# Patient Record
Sex: Female | Born: 1995 | Hispanic: Yes | Marital: Single
Health system: Southern US, Community
[De-identification: ages and names within clinical notes are randomized; demographics above are authoritative.]

---
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
## 2016-12-14 ENCOUNTER — Emergency Department
Admission: EM | Admit: 2016-12-14 | Discharge: 2016-12-14 | Disposition: A | Discharge status: Home / Self Care | Payer: Medicaid Other | Attending: Emergency Medicine | Admitting: Emergency Medicine

## 2016-12-14 ENCOUNTER — Emergency Department: Payer: Medicaid Other

## 2016-12-14 DIAGNOSIS — Y93E5 Activity, floor mopping and cleaning: Secondary | ICD-10-CM | POA: Insufficient documentation

## 2016-12-14 DIAGNOSIS — Y990 Civilian activity done for income or pay: Secondary | ICD-10-CM | POA: Insufficient documentation

## 2016-12-14 DIAGNOSIS — S59911A Unspecified injury of right forearm, initial encounter: Secondary | ICD-10-CM | POA: Diagnosis present

## 2016-12-14 DIAGNOSIS — Y92002 Bathroom of unspecified non-institutional (private) residence single-family (private) house as the place of occurrence of the external cause: Secondary | ICD-10-CM | POA: Insufficient documentation

## 2016-12-14 DIAGNOSIS — S51811A Laceration without foreign body of right forearm, initial encounter: Secondary | ICD-10-CM | POA: Diagnosis not present

## 2016-12-14 DIAGNOSIS — W2209XA Striking against other stationary object, initial encounter: Secondary | ICD-10-CM | POA: Insufficient documentation

## 2016-12-14 MED ORDER — LIDOCAINE-EPINEPHRINE 2 %-1:100000 IJ SOLN
3.0000 mL | Freq: Once | INTRAMUSCULAR | Status: AC
  Administered 2016-12-14: 3 mL
  Filled 2016-12-14: qty 3.4

## 2016-12-14 MED ORDER — CEPHALEXIN 500 MG PO CAPS: 500.0000 mg | ORAL_CAPSULE | Freq: Four times a day (QID) | ORAL | 0 refills | Status: AC

## 2016-12-14 NOTE — ED Triage Notes (Signed)
While at work today pt reports hitting shower soap holder and when holder broke it cut her right forearm. Laceration noted to right forearm with new bandage applied. Laceration continues to bleeding at this time. Sensation intact and color appropriate. Pulse intact and strong at this time.

## 2016-12-14 NOTE — ED Provider Notes (Signed)
[facility] Emergency Department Provider Note  ____________________________________________  Time seen: Approximately 4:51 PM  I have reviewed the triage vital signs and the nursing notes.   HISTORY  Chief Complaint No chief complaint on file.    HPI Amber HiltsJessica Johns is a 21 y.o. 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 Patient denies prior traumas or surgeries to the right upper extremity. Patient denies falling. She has attempted no other alleviating measures besides the application of a clean dressing. Patient denies chest pain, chest tightness, shortness of breath, abdominal pain, nausea and vomiting.  No past medical history on file.  There are no active problems to display for this patient.   No past surgical history on file.  Prior to Admission medications   Medication Sig Start Date End Date Taking? Authorizing Provider  cephALEXin (KEFLEX) 500 MG capsule Take 1 capsule (500 mg total) by mouth 4 (four) times daily. 12/14/16 12/24/16  Orvil FeilJaclyn M Jurline Folger, PA-C    Allergies Patient has no allergy information on record.  No family history on file.  Social History Social History  Substance Use Topics  . Smoking status: Not on file  . Smokeless tobacco: Not on file  . Alcohol use Not on file    Review of Systems  Constitutional: No fever/chills Eyes: No visual changes. No discharge ENT: No upper respiratory complaints. Cardiovascular: no chest pain. Respiratory: no cough. No SOB. Musculoskeletal: Negative for musculoskeletal pain. Skin: Patient has a right forearm laceration. Neurological: Negative for headaches, focal weakness or numbness. ____________________________________________   PHYSICAL EXAM:  VITAL SIGNS: ED Triage Vitals [12/14/16 1559]  Enc Vitals Group     BP (!) 122/91   Pulse Rate 72     Resp 16     Temp 99.1 F (37.3 C)     Temp Source Oral     SpO2 96 %     Weight      Height      Head Circumference      Peak Flow      Pain Score      Pain Loc      Pain Edu?      Excl. in GC?      Constitutional: Alert and oriented. Well appearing and in no acute distress. Eyes: Conjunctivae are normal. PERRL. EOMI. Head: Atraumatic. Cardiovascular: Normal rate, regular rhythm. Normal S1 and S2.  Good peripheral circulation. Respiratory: Normal respiratory effort without tachypnea or retractions. Lungs CTAB. Good air entry to the bases with no decreased or absent breath sounds. Musculoskeletal: Full range of motion to all extremities. No gross deformities appreciated. Neurologic:  Normal speech and language. No gross focal neurologic deficits are appreciated.  Skin:  Patient has a 2 cm semilunar laceration of the skin overlying the volar forearm.  Psychiatric: Mood and affect are normal. Speech and behavior are normal. Patient exhibits appropriate insight and judgement.   ____________________________________________   LABS (all labs ordered are listed, but only abnormal results are displayed)  Labs Reviewed - No data to display ____________________________________________  EKG   ____________________________________________  RADIOLOGY   Dg Forearm Right  Result Date: 12/14/2016 CLINICAL DATA:  Pt was cleaning bathroom shower at work, [employer] holder fell and broke. Pt has laceration on posterior mid shaft of right forearm. Shielded. EXAM: RIGHT FOREARM - 2 VIEW COMPARISON:  None. FINDINGS: Two views of the right forearm submitted. No  acute fracture or subluxation. There is soft tissue irregularity probable laceration mid and dorsal aspect of the forearm. No radiopaque foreign body is noted. IMPRESSION: No acute fracture or subluxation. There is soft tissue irregularity probable laceration mid and dorsal aspect of the forearm. No radiopaque foreign body is  noted. Electronically Signed   By: Natasha Mead M.D.   On: 12/14/2016 17:05    ____________________________________________    PROCEDURES  Procedure(s) performed:    Procedures    Medications  lidocaine-EPINEPHrine (XYLOCAINE W/EPI) 2 %-1:100000 (with pres) injection 3 mL (3 mLs Infiltration Given by Other 12/14/16 1759)   LACERATION REPAIR Performed by: Orvil Feil Authorized by: Orvil Feil Consent: Verbal consent obtained. Risks and benefits: risks, benefits and alternatives were discussed Consent given by: patient Patient identity confirmed: provided demographic data Prepped and Draped in normal sterile fashion Wound explored  Laceration Location: Volar forearm   Laceration Length: 2 cm  No Foreign Bodies seen or palpated  Anesthesia: local infiltration  Local anesthetic: lidocaine 1% with epinephrine  Anesthetic total: 3 ml  Irrigation method: syringe Amount of cleaning: standard  Skin closure:  Superficial: 5-0 Ethilon Deep: 4-0 Raptide   Number of sutures:  Superficial: 9 Deep: 7  Technique: Superficial: Simple interrupted Deep: Subcutaneous interrupted   Patient tolerance: Patient tolerated the procedure well with no immediate complications.   ____________________________________________   INITIAL IMPRESSION / ASSESSMENT AND PLAN / ED COURSE  Pertinent labs & imaging results that were available during my care of the patient were reviewed by me and considered in my medical decision making (see chart for details).  Review of the [place] CSRS was performed in accordance of the [facility] prior to dispensing any controlled drugs.    Assessment and plan:  Right forearm laceration Patient presents to the emergency department with a right volar forearm laceration. DG right forearm revealed no acute fractures or bony abnormalities. Patient underwent laceration repair in the emergency department. Patient tolerated the procedure well. Patient was advised  to have primary care provider remove superficial sutures in 9 days. Patient was discharged with Keflex. All patient questions were answered. ____________________________________________  FINAL CLINICAL IMPRESSION(S) / ED DIAGNOSES  Final diagnoses:  Laceration of right forearm, initial encounter      NEW MEDICATIONS STARTED DURING THIS VISIT:  New Prescriptions   CEPHALEXIN (KEFLEX) 500 MG CAPSULE    Take 1 capsule (500 mg total) by mouth 4 (four) times daily.        This chart was dictated using voice recognition software/Dragon. Despite best efforts to proofread, errors can occur which can change the meaning. Any change was purely unintentional.    Orvil Feil, PA-C 12/15/16 0008    Emily Filbert, MD 12/15/16 Barry Brunner

## 2018-03-19 IMAGING — DX DG FOREARM 2V*R*
2 series · 2 of 2 positions shown · non-contrast
Comparison: None.

CLINICAL DATA: Pt was cleaning bathroom shower at work, Jacyl Qasab Tsadiku
fell and broke. Pt has laceration on posterior mid shaft of right
forearm. Shielded.

EXAM:
RIGHT FOREARM - 2 VIEW

[forearm ap]
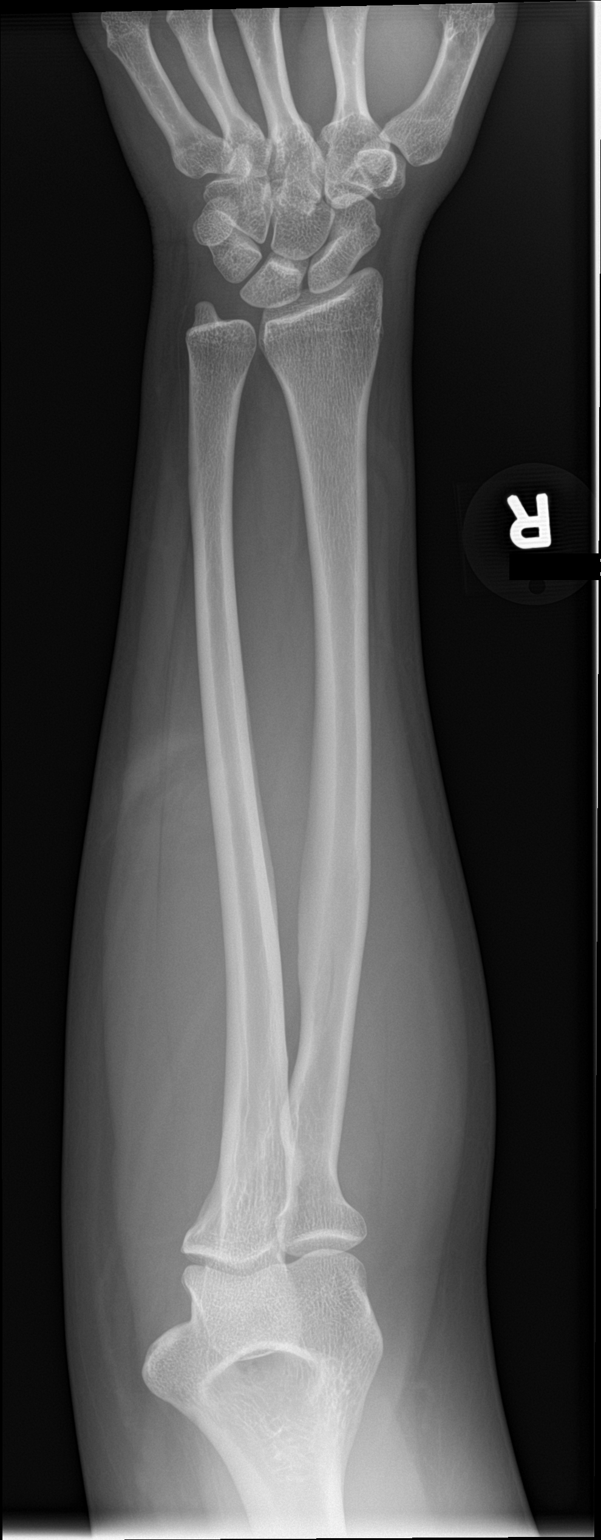

[forearm lat]
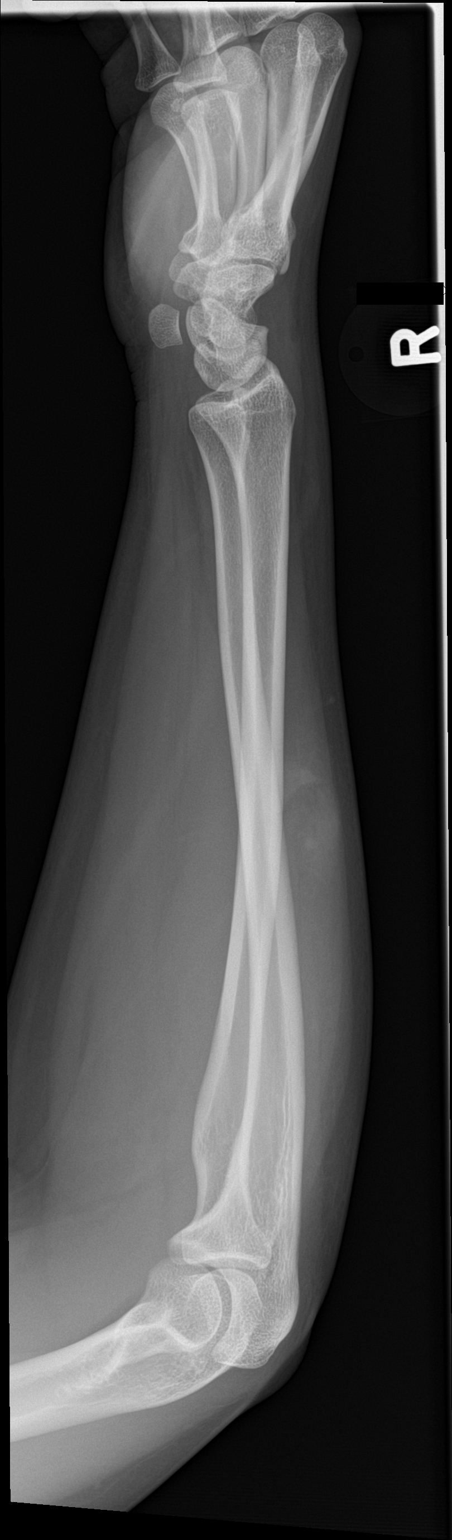

[2 of 2 positions shown; findings below may reference images not displayed]

FINDINGS: Two views of the right forearm submitted. No acute fracture or
subluxation. There is soft tissue irregularity probable laceration
mid and dorsal aspect of the forearm. No radiopaque foreign body is
noted.
IMPRESSION: No acute fracture or subluxation. There is soft tissue irregularity
probable laceration mid and dorsal aspect of the forearm. No
radiopaque foreign body is noted.

## 2019-05-25 ENCOUNTER — Other Ambulatory Visit: Payer: Self-pay

## 2019-05-25 DIAGNOSIS — Z20822 Contact with and (suspected) exposure to covid-19: Secondary | ICD-10-CM

## 2019-05-27 LAB — NOVEL CORONAVIRUS, NAA: SARS-CoV-2, NAA: DETECTED — AB
